# Patient Record
Sex: Male | Born: 1980 | Race: Black or African American | Hispanic: No | Marital: Single
Health system: Southern US, Community
[De-identification: ages and names within clinical notes are randomized; demographics above are authoritative.]

---

## 2005-12-22 ENCOUNTER — Emergency Department (HOSPITAL_COMMUNITY): Admission: EM | Admit: 2005-12-22 | Discharge: 2005-12-22 | Payer: Self-pay | Admitting: *Deleted
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
## 2007-12-27 ENCOUNTER — Emergency Department (HOSPITAL_COMMUNITY): Admission: EM | Admit: 2007-12-27 | Discharge: 2007-12-27 | Payer: Self-pay | Admitting: Emergency Medicine

## 2008-10-28 ENCOUNTER — Emergency Department (HOSPITAL_COMMUNITY): Admission: EM | Admit: 2008-10-28 | Discharge: 2008-10-28 | Payer: Self-pay | Admitting: Emergency Medicine

## 2022-07-06 ENCOUNTER — Encounter (HOSPITAL_COMMUNITY): Payer: Self-pay

## 2022-07-06 ENCOUNTER — Ambulatory Visit (HOSPITAL_COMMUNITY)
Admission: EM | Admit: 2022-07-06 | Discharge: 2022-07-06 | Disposition: A | Discharge status: Home / Self Care | Payer: BLUE CROSS/BLUE SHIELD | Attending: Internal Medicine | Admitting: Internal Medicine

## 2022-07-06 DIAGNOSIS — Z113 Encounter for screening for infections with a predominantly sexual mode of transmission: Secondary | ICD-10-CM | POA: Insufficient documentation

## 2022-07-06 NOTE — ED Provider Notes (Signed)
[facility]    CSN: [id number] Arrival date & time: 07/06/22  1119      History   Chief Complaint Chief Complaint  Patient presents with   Exposure to STD    HPI Brently Stawski is a 42 y.o. male.   Patient is a very poor historian but reports that he is here for routine STD testing.  He states that he had a new sexual partner and would like testing.  Denies exposure to STD or any associated symptoms at this time.  Nurse reports that she was not able to get temperature given patient cooperation.   Exposure to STD    History reviewed. No pertinent past medical history.  There are no problems to display for this patient.   History reviewed. No pertinent surgical history.     Home Medications    Prior to Admission medications   Not on File    Family History History reviewed. No pertinent family history.  Social History Social History   Tobacco Use   Smoking status: Unknown     Allergies   Amoxicillin   Review of Systems Review of Systems Per HPI  Physical Exam Triage Vital Signs ED Triage Vitals [07/06/22 1235]  Enc Vitals Group     BP (!) 138/95     Pulse Rate 82     Resp 16     Temp      Temp src      SpO2 98 %     Weight      Height      Head Circumference      Peak Flow      Pain Score      Pain Loc      Pain Edu?      Excl. in GC?    No data found.  Updated Vital Signs BP (!) 138/95 (BP Location: Left Arm)   Pulse 82   Resp 16   SpO2 98%   Visual Acuity Right Eye Distance:   Left Eye Distance:   Bilateral Distance:    Right Eye Near:   Left Eye Near:    Bilateral Near:     Physical Exam Constitutional:      General: He is not in acute distress.    Appearance: Normal appearance. He is not toxic-appearing or diaphoretic.  HENT:     Head: Normocephalic and atraumatic.  Eyes:     Extraocular Movements: Extraocular movements intact.     Conjunctiva/sclera: Conjunctivae normal.  Pulmonary:     Effort:  Pulmonary effort is normal.  Genitourinary:    Comments: Deferred with shared decision making.  Self swab performed. Neurological:     General: No focal deficit present.     Mental Status: He is alert and oriented to person, place, and time. Mental status is at baseline.  Psychiatric:        Mood and Affect: Mood normal.        Behavior: Behavior normal.        Thought Content: Thought content normal.        Judgment: Judgment normal.      UC Treatments / Results  Labs (all labs ordered are listed, but only abnormal results are displayed) Labs Reviewed  CYTOLOGY, (ORAL, ANAL, URETHRAL) ANCILLARY ONLY    EKG   Radiology No results found.  Procedures Procedures (including critical care time)  Medications Ordered in UC Medications - No data to display  Initial Impression / Assessment and Plan / UC  Course  I have reviewed the triage vital signs and the nursing notes.  Pertinent labs & imaging results that were available during my care of the patient were reviewed by me and considered in my medical decision making (see chart for details).     Cytology swab pending.  Given no confirmed exposure to STD, will await results for any treatment if necessary.  Patient declined HIV and RPR.  Discussed return precautions.  Patient verbalized understanding and was agreeable. Final Clinical Impressions(s) / UC Diagnoses   Final diagnoses:  Screening examination for venereal disease   Discharge Instructions   None    ED Prescriptions   None    PDMP not reviewed this encounter.   Gustavus Bryant, [place] 07/06/22 1252

## 2022-07-06 NOTE — ED Triage Notes (Signed)
Here for STI;denies any other symptoms.

## 2022-07-07 LAB — CYTOLOGY, (ORAL, ANAL, URETHRAL) ANCILLARY ONLY
Chlamydia: NEGATIVE
Comment: NORMAL
Neisseria Gonorrhea: NEGATIVE

## 2022-07-10 ENCOUNTER — Telehealth (HOSPITAL_COMMUNITY): Payer: Self-pay | Admitting: Emergency Medicine

## 2022-07-10 NOTE — Telephone Encounter (Signed)
Cyto results did not show Trichomonas result.  Called down to cyto lab, they will add on today

## 2022-07-15 ENCOUNTER — Telehealth: Payer: Self-pay | Admitting: Emergency Medicine

## 2022-07-15 NOTE — Telephone Encounter (Signed)
Called down to follow up with lab with missing Trichomonas result, she states should result today by 4pm

## 2022-07-16 LAB — CYTOLOGY, (ORAL, ANAL, URETHRAL) ANCILLARY ONLY
Comment: NEGATIVE
Comment: NORMAL
Trichomonas: NEGATIVE
